# Patient Record
Sex: Male | Born: 1985 | Hispanic: Yes | Marital: Married | Smoking: Never smoker
Health system: Southern US, Community
[De-identification: ages and names within clinical notes are randomized; demographics above are authoritative.]

---

## 2013-08-21 ENCOUNTER — Emergency Department (HOSPITAL_BASED_OUTPATIENT_CLINIC_OR_DEPARTMENT_OTHER)
Admission: EM | Admit: 2013-08-21 | Discharge: 2013-08-21 | Disposition: A | Discharge status: Home / Self Care | Payer: Self-pay | Attending: Emergency Medicine | Admitting: Emergency Medicine

## 2013-08-21 ENCOUNTER — Emergency Department (HOSPITAL_BASED_OUTPATIENT_CLINIC_OR_DEPARTMENT_OTHER): Payer: Self-pay

## 2013-08-21 ENCOUNTER — Encounter (HOSPITAL_BASED_OUTPATIENT_CLINIC_OR_DEPARTMENT_OTHER): Payer: Self-pay | Admitting: Emergency Medicine

## 2013-08-21 DIAGNOSIS — M25579 Pain in unspecified ankle and joints of unspecified foot: Secondary | ICD-10-CM | POA: Insufficient documentation

## 2013-08-21 DIAGNOSIS — M25571 Pain in right ankle and joints of right foot: Secondary | ICD-10-CM

## 2013-08-21 MED ORDER — METHYLPREDNISOLONE SODIUM SUCC 125 MG IJ SOLR
125.0000 mg | Freq: Once | INTRAMUSCULAR | Status: AC
  Administered 2013-08-21: 125 mg via INTRAMUSCULAR
  Filled 2013-08-21: qty 2

## 2013-08-21 MED ORDER — HYDROCODONE-ACETAMINOPHEN 5-325 MG PO TABS: 1.0000 | ORAL_TABLET | Freq: Four times a day (QID) | ORAL | Status: AC | PRN

## 2013-08-21 NOTE — ED Notes (Signed)
Pt c/o rt ankle pain x3wks, no known injury, taking tylenol with no relief.

## 2013-08-21 NOTE — ED Provider Notes (Signed)
CSN: [id number]     Arrival date & time 08/21/13  0212 History   First MD Initiated Contact with Patient 08/21/13 [phone]     Chief Complaint  Patient presents with  . Ankle Pain     (Consider location/radiation/quality/duration/timing/severity/associated sxs/prior Treatment) HPI This is a morbidly obese 28 year old male with a pain in his right ankle for the last 2-3 weeks. The pain has been mild and easily treated with acetaminophen until the last 24 hours. Over the last 24 hours the pain has become severe and not relieved with Tylenol. He has not taken any other medication. He denies any injury. The pain is located deep in the joint and is worse with lateral movement versus flexion and extension. There is no associated swelling or redness. He has no personal history of gout but has a family history of gout.   History reviewed. No pertinent past medical history. History reviewed. No pertinent past surgical history. No family history on file. History  Substance Use Topics  . Smoking status: Never Smoker   . Smokeless tobacco: Not on file  . Alcohol Use: No    Review of Systems  All other systems reviewed and are negative.   Allergies  Asa and Nsaids  Home Medications   Prior to Admission medications   Not on File   BP 105/56  Pulse 64  Temp(Src) 98.2 F (36.8 C) (Oral)  Resp 20  Ht 5\' 9"  (1.753 m)  Wt 398 lb (180.532 kg)  BMI 58.75 kg/m2  SpO2 98%  Physical Exam General: Well-developed, morbidly obese male in no acute distress; appearance consistent with age of record HENT: normocephalic; atraumatic Eyes: pupils equal, round and reactive to light; extraocular muscles intact Neck: supple Heart: regular rate and rhythm Lungs: clear to auscultation bilaterally Abdomen: soft; nondistended; nontender; bowel sounds present Extremities: No deformity; pain on passive range of motion of right ankle, notably medial and lateral stress; right ankle without appreciable warmth,  swelling or erythema; right foot distally neurovascularly intact with intact tendon function Neurologic: Awake, alert and oriented; motor function intact in all extremities and symmetric; no facial droop Skin: Warm and dry Psychiatric: Normal mood and affect    ED Course  Procedures (including critical care time)  MDM  Nursing notes and vitals signs, including pulse oximetry, reviewed.  Summary of this visit's results, reviewed by myself:  Labs:  No results found for this or any previous visit (from the past 24 hour(s)).  Imaging Studies: Dg Ankle Complete Right  08/21/2013   CLINICAL DATA:  Ankle pain for several weeks.  No known injury.  EXAM: RIGHT ANKLE - COMPLETE 3+ VIEW  COMPARISON:  None.  FINDINGS: There is no evidence of fracture, dislocation, or joint effusion. There is no evidence of arthropathy or other focal bone abnormality. Small plantar calcaneal spur, without erosion.  IMPRESSION: Negative.   Electronically Signed   By: Tiburcio PeaJonathan  Watts M.D.   On: 08/21/2013 03:07   Suspect gout due to acute onset of nontraumatic pain. He is morbidly obese and has a family history of gout. He has an anaphylactic reaction to aspirin and nonsteroidals so we will treat with a shot of Solu-Medrol and a narcotic.     Kyle SeamenJohn L Marija Calamari, MD 08/21/13 [phone]

## 2013-08-21 NOTE — ED Notes (Signed)
Patient transported to X-ray 

## 2013-08-21 NOTE — Discharge Instructions (Signed)

## 2013-09-27 ENCOUNTER — Emergency Department (HOSPITAL_BASED_OUTPATIENT_CLINIC_OR_DEPARTMENT_OTHER)
Admission: EM | Admit: 2013-09-27 | Discharge: 2013-09-27 | Disposition: A | Discharge status: Home / Self Care | Payer: Self-pay | Attending: Emergency Medicine | Admitting: Emergency Medicine

## 2013-09-27 ENCOUNTER — Emergency Department (HOSPITAL_BASED_OUTPATIENT_CLINIC_OR_DEPARTMENT_OTHER): Payer: Self-pay

## 2013-09-27 ENCOUNTER — Encounter (HOSPITAL_BASED_OUTPATIENT_CLINIC_OR_DEPARTMENT_OTHER): Payer: Self-pay | Admitting: Emergency Medicine

## 2013-09-27 DIAGNOSIS — B9789 Other viral agents as the cause of diseases classified elsewhere: Secondary | ICD-10-CM | POA: Insufficient documentation

## 2013-09-27 DIAGNOSIS — J029 Acute pharyngitis, unspecified: Secondary | ICD-10-CM | POA: Insufficient documentation

## 2013-09-27 DIAGNOSIS — B349 Viral infection, unspecified: Secondary | ICD-10-CM

## 2013-09-27 LAB — RAPID STREP SCREEN (MED CTR MEBANE ONLY): Streptococcus, Group A Screen (Direct): NEGATIVE

## 2013-09-27 MED ORDER — ACETAMINOPHEN 325 MG PO TABS
650.0000 mg | ORAL_TABLET | Freq: Once | ORAL | Status: AC
  Administered 2013-09-27: 650 mg via ORAL
  Filled 2013-09-27: qty 2

## 2013-09-27 NOTE — ED Notes (Signed)
Patient transported to X-ray 

## 2013-09-27 NOTE — ED Provider Notes (Signed)
CSN: [id number]     Arrival date & time 09/27/13  1420 History   First MD Initiated Contact with Patient 09/27/13 1533     Chief Complaint  Patient presents with  . Generalized Body Aches  . Sore Throat     HPI The patient presents emergent complaints of sore throat, body aches that started yesterday. Patient started having some trouble with sore throat. He feels like it's more difficult to swallow. He's developed generalized body aches and has felt feverish and chilled. He did not measure temperature at home last night. He also noticed that his eyes started becoming red. Has not had any recent travel. He had all his childhood immunizations. He is not aware of anyone has been sick. Has not had any recent tick bites. History reviewed. No pertinent past medical history. History reviewed. No pertinent past surgical history. No family history on file. History  Substance Use Topics  . Smoking status: Never Smoker   . Smokeless tobacco: Not on file  . Alcohol Use: No    Review of Systems  All other systems reviewed and are negative.     Allergies  Asa and Nsaids  Home Medications   Prior to Admission medications   Medication Sig Start Date End Date Taking? Authorizing Provider  HYDROcodone-acetaminophen (NORCO/VICODIN) 5-325 MG per tablet Take 1-2 tablets by mouth every 6 (six) hours as needed. 08/21/13   John L Molpus, MD   BP 133/70  Pulse 81  Temp(Src) 98.9 F (37.2 C) (Oral)  Resp 18  Ht  (1.753 m)  Wt 390 lb (176.903 kg)  BMI 57.57 kg/m2  SpO2 97% Physical Exam  Nursing note and vitals reviewed. Constitutional: No distress.  Obese   HENT:  Head: Normocephalic and atraumatic.  Right Ear: External ear normal.  Left Ear: External ear normal.  Mouth/Throat: Mucous membranes are normal. No oral lesions. No trismus in the jaw. No uvula swelling. Posterior oropharyngeal edema and posterior oropharyngeal erythema present. No oropharyngeal exudate or tonsillar  abscesses.  Eyes: Conjunctivae are normal. Right eye exhibits no discharge. Left eye exhibits no discharge. No scleral icterus.  Neck: Neck supple. No tracheal deviation present.  Cardiovascular: Normal rate, regular rhythm and intact distal pulses.   Pulmonary/Chest: Effort normal. No accessory muscle usage or stridor. Not tachypneic. No respiratory distress. He has no decreased breath sounds. He has wheezes (few). He has no rales.  Abdominal: Soft. Bowel sounds are normal. He exhibits no distension. There is no tenderness. There is no rebound and no guarding.  Musculoskeletal: He exhibits no edema and no tenderness.  Neurological: He is alert. He has normal strength. No cranial nerve deficit (no facial droop, extraocular movements intact, no slurred speech) or sensory deficit. He exhibits normal muscle tone. He displays no seizure activity. Coordination normal.  Skin: Skin is warm. No rash noted. He is not diaphoretic.  Psychiatric: He has a normal mood and affect.    ED Course  Procedures (including critical care time) Labs Review Labs Reviewed  RAPID STREP SCREEN  CULTURE, GROUP A STREP  STREP A CULTURE, THROAT    Imaging Review Dg Chest 2 View  09/27/2013   CLINICAL DATA:  Generalized body aches, wheezing  EXAM: CHEST  2 VIEW  COMPARISON:  None.  FINDINGS: The heart size and mediastinal contours are within normal limits. Both lungs are clear. The visualized skeletal structures are unremarkable.  IMPRESSION: No active cardiopulmonary disease.   Electronically Signed   By: Isac Caddy.D.  On: 09/27/2013 16:11     MDM   Final diagnoses:  Pharyngitis  Viral illness    Non toxic.  Likely viral illness.  Has conjunctival injection and sore throat.    No pna on xray.  Tylenol for symptoms.  Follow up with PCP    Linwood Dibbles, MD 09/27/13 [phone]

## 2013-09-27 NOTE — Discharge Instructions (Signed)

## 2013-09-27 NOTE — ED Notes (Signed)
Pt. Reports sore throat and body aches with fever off and on.  At home fever was highest 98.9 per wife.

## 2013-09-29 LAB — CULTURE, GROUP A STREP

## 2013-10-01 ENCOUNTER — Emergency Department (HOSPITAL_BASED_OUTPATIENT_CLINIC_OR_DEPARTMENT_OTHER)
Admission: EM | Admit: 2013-10-01 | Discharge: 2013-10-01 | Disposition: A | Discharge status: Home / Self Care | Payer: Self-pay | Attending: Emergency Medicine | Admitting: Emergency Medicine

## 2013-10-01 ENCOUNTER — Encounter (HOSPITAL_BASED_OUTPATIENT_CLINIC_OR_DEPARTMENT_OTHER): Payer: Self-pay | Admitting: Emergency Medicine

## 2013-10-01 DIAGNOSIS — R51 Headache: Secondary | ICD-10-CM | POA: Insufficient documentation

## 2013-10-01 DIAGNOSIS — G4489 Other headache syndrome: Secondary | ICD-10-CM | POA: Insufficient documentation

## 2013-10-01 MED ORDER — SODIUM CHLORIDE 0.9 % IV BOLUS (SEPSIS)
1000.0000 mL | Freq: Once | INTRAVENOUS | Status: AC
  Administered 2013-10-01: 1000 mL via INTRAVENOUS

## 2013-10-01 MED ORDER — METOCLOPRAMIDE HCL 5 MG/ML IJ SOLN
10.0000 mg | Freq: Once | INTRAMUSCULAR | Status: AC
  Administered 2013-10-01: 10 mg via INTRAVENOUS
  Filled 2013-10-01: qty 2

## 2013-10-01 MED ORDER — DIPHENHYDRAMINE HCL 50 MG/ML IJ SOLN
25.0000 mg | Freq: Once | INTRAMUSCULAR | Status: AC
Start: 2013-10-01 — End: 2013-10-01
  Administered 2013-10-01: 25 mg via INTRAVENOUS
  Filled 2013-10-01: qty 1

## 2013-10-01 NOTE — ED Notes (Signed)
Headache. Was seen for same on Friday.

## 2013-10-01 NOTE — ED Notes (Signed)
PT discharged to home with family. NAD. 

## 2013-10-01 NOTE — Discharge Instructions (Signed)
Return to the ED with any concerns including fever/chills, difficulty breathing or swallowing, changes in vision or speech, vomiting and not able to keep down liquids, decreased level of alertness/lethargy, or any other alarming symptoms

## 2013-10-01 NOTE — ED Provider Notes (Signed)
CSN: [id number]     Arrival date & time 10/01/13  1853 History  This chart was scribed for Kyle Chick, MD by Roxy Cedar, ED Scribe. This patient was seen in room MH03/MH03 and the patient's care was started at 8:52 PM.   Chief Complaint  Patient presents with  . Headache   Patient is a 28 y.o. male presenting with headaches. The history is provided by the patient. No language interpreter was used.  Headache Pain location:  Generalized Quality:  Sharp Radiates to:  Does not radiate Onset quality:  Gradual Duration:  1 day Timing:  Constant Progression:  Unchanged Chronicity:  Recurrent Relieved by:  Nothing Worsened by:  Nothing tried Ineffective treatments:  None tried Associated symptoms: sinus pressure   Associated symptoms: no fever     HPI Comments: Kyle Kent is a 28 y.o. male who is allergic to [place] and Nsaids, who presents to the Emergency Department complaining of moderate headache that began yesterday. Patient states that he feels associated pressure behind his eyes. He states that he cannot perform any sudden movement without feeling moderate discomfort and pain. Patient was seen last week with viral symptoms. He states that he feels weak due to headache but symptoms from last week have improved. HA gradual in onset, throbbing in nature.    History reviewed. No pertinent past medical history. History reviewed. No pertinent past surgical history. No family history on file. History  Substance Use Topics  . Smoking status: Never Smoker   . Smokeless tobacco: Not on file  . Alcohol Use: No    Review of Systems  Constitutional: Negative for fever.  HENT: Positive for sinus pressure.   Genitourinary: Negative for difficulty urinating.  Neurological: Positive for headaches.  All other systems reviewed and are negative.  Allergies  Asa and Nsaids  Home Medications   Prior to Admission medications   Medication Sig Start Date End Date Taking?  Authorizing Provider  HYDROcodone-acetaminophen (NORCO/VICODIN) 5-325 MG per tablet Take 1-2 tablets by mouth every 6 (six) hours as needed. 08/21/13   Carlisle Beers Molpus, MD   Triage Vitals: BP 135/74  Pulse 84  Temp(Src) 98.5 F (36.9 C) (Oral)  Resp 20  Ht  (1.753 m)  Wt 390 lb (176.903 kg)  BMI 57.57 kg/m2  SpO2 98%  Physical Exam  Nursing note and vitals reviewed. Constitutional: He is oriented to person, place, and time. He appears well-developed and well-nourished. No distress.  HENT:  Head: Normocephalic and atraumatic.  Nose: Nose normal.  Mouth/Throat: Oropharynx is clear and moist. No oropharyngeal exudate.  No pharyngitis noted.  Eyes: Conjunctivae and EOM are normal. Pupils are equal, round, and reactive to light. Right eye exhibits no discharge. Left eye exhibits no discharge.  Neck: Neck supple. No tracheal deviation present.  Cardiovascular: Normal rate.   Pulmonary/Chest: Effort normal. No respiratory distress.  Musculoskeletal: Normal range of motion.  Neurological: He is alert and oriented to person, place, and time. He has normal strength and normal reflexes. No cranial nerve deficit. He exhibits normal muscle tone. Coordination normal.  Normal neurologic exam. Cranial nerves II-XII tested and intact. Strength 5/5. Sensation intact.   Skin: Skin is warm and dry.  Psychiatric: He has a normal mood and affect. His behavior is normal.   ED Course  Procedures (including critical care time)  DIAGNOSTIC STUDIES: Oxygen Saturation is 98% on RA, normal by my interpretation.    COORDINATION OF CARE: 8:55 PM- Will give patient  Reglan, Benadryl and NaCl Bolus for pain and sinus management. Discussed plans to discharge. Pt advised of plan for treatment and pt agrees.  10:50 PM pt is sleeping and has had much improvement in headache.    Labs Review Labs Reviewed - No data to display  Imaging Review No results found.   EKG Interpretation None     MDM   Final  diagnoses:  Other headache syndrome    Pt presenting with headache in the setting of recent ED visit for viral infection/pharyngitis.  He states other symptoms have improved, but then he had gradual onset of headache.  No fever, no meningeal signs.  Normal neuro exam.  Dobut meningitis, SAH.  Pt feels improved after reglan/benadryl.  Discharged with strict return precautions.  Pt agreeable with plan.  I personally performed the services described in this documentation, which was scribed in my presence. The recorded information has been reviewed and is accurate.    Kyle Chick, MD 10/01/13 2322

## 2014-02-21 ENCOUNTER — Emergency Department (HOSPITAL_BASED_OUTPATIENT_CLINIC_OR_DEPARTMENT_OTHER)
Admission: EM | Admit: 2014-02-21 | Discharge: 2014-02-22 | Disposition: A | Discharge status: Home / Self Care | Payer: Self-pay | Attending: Emergency Medicine | Admitting: Emergency Medicine

## 2014-02-21 ENCOUNTER — Encounter (HOSPITAL_BASED_OUTPATIENT_CLINIC_OR_DEPARTMENT_OTHER): Payer: Self-pay | Admitting: Emergency Medicine

## 2014-02-21 ENCOUNTER — Emergency Department (HOSPITAL_BASED_OUTPATIENT_CLINIC_OR_DEPARTMENT_OTHER): Payer: Self-pay

## 2014-02-21 DIAGNOSIS — M778 Other enthesopathies, not elsewhere classified: Secondary | ICD-10-CM | POA: Insufficient documentation

## 2014-02-21 DIAGNOSIS — Z5689 Other problems related to employment: Secondary | ICD-10-CM | POA: Insufficient documentation

## 2014-02-21 NOTE — ED Notes (Signed)
Patient reports that he is having left sided elbow pain x 2 -3 days. The patient reports that he was doing his typical work. Patient reports that his shoulder and wrist hurt as well.

## 2014-02-22 MED ORDER — OXYCODONE-ACETAMINOPHEN 5-325 MG PO TABS
2.0000 | ORAL_TABLET | Freq: Once | ORAL | Status: AC
  Administered 2014-02-22: 2 via ORAL
  Filled 2014-02-22: qty 2

## 2014-02-22 MED ORDER — OXYCODONE-ACETAMINOPHEN 5-325 MG PO TABS: 1.0000 | ORAL_TABLET | ORAL | Status: AC | PRN

## 2014-02-22 MED ORDER — PREDNISONE 10 MG PO TABS
60.0000 mg | ORAL_TABLET | Freq: Once | ORAL | Status: AC
  Administered 2014-02-22: 60 mg via ORAL
  Filled 2014-02-22 (×2): qty 1

## 2014-02-22 MED ORDER — METHYLPREDNISOLONE (PAK) 4 MG PO TABS: ORAL_TABLET | ORAL | Status: AC

## 2014-02-22 NOTE — ED Provider Notes (Signed)
TIME SEEN: 12:55 AM  CHIEF COMPLAINT: left elbow pain  HPI: Pt is a 29 y.o. morbidly obese left-hand dominant male who presents emergency department with left elbow pain for the past 2-3 days. Patient reports his pain is worse with movement of the arm. No fever. No history of injury. His elbow is swollen. Has never had similar symptoms.  ROS: See HPI Constitutional: no fever  Eyes: no drainage  ENT: no runny nose   Cardiovascular:  no chest pain  Resp: no SOB  GI: no vomiting GU: no dysuria Integumentary: no rash  Allergy: no hives  Musculoskeletal: no leg swelling  Neurological: no slurred speech ROS otherwise negative  PAST MEDICAL HISTORY/PAST SURGICAL HISTORY:  History reviewed. No pertinent past medical history.  MEDICATIONS:  Prior to Admission medications   Medication Sig Start Date End Date Taking? Authorizing Provider  HYDROcodone-acetaminophen (NORCO/VICODIN) 5-325 MG per tablet Take 1-2 tablets by mouth every 6 (six) hours as needed. 08/21/13   Carlisle BeersJohn L Molpus, MD    ALLERGIES:  Allergies  Allergen Reactions  . Asa [Aspirin] Anaphylaxis  . Nsaids Anaphylaxis    SOCIAL HISTORY:  History  Substance Use Topics  . Smoking status: Never Smoker   . Smokeless tobacco: Not on file  . Alcohol Use: No    FAMILY HISTORY: History reviewed. No pertinent family history.  EXAM: BP 133/81 mmHg  Pulse 86  Temp(Src) 98.4 F (36.9 C) (Oral)  Resp 18  Wt 395 lb (179.171 kg)  SpO2 100% CONSTITUTIONAL: Alert and oriented and responds appropriately to questions. Well-appearing; well-nourished HEAD: Normocephalic EYES: Conjunctivae clear, PERRL ENT: normal nose; no rhinorrhea; moist mucous membranes; pharynx without lesions noted NECK: Supple, no meningismus, no LAD  CARD: RRR; S1 and S2 appreciated; no murmurs, no clicks, no rubs, no gallops RESP: Normal chest excursion without splinting or tachypnea; breath sounds clear and equal bilaterally; no wheezes, no rhonchi, no  rales,  ABD/GI: Normal bowel sounds; non-distended; soft, non-tender, no rebound, no guarding BACK:  The back appears normal and is non-tender to palpation, there is no CVA tenderness EXT: patient's left elbow is swollen and tender to palpation diffusely without erythema or warmth, no swelling of the olecranon bursa, pain with flexion and extension of the elbow but he is able to do it passively, 2+ radial pulses bilaterally, no tenderness at the left shoulder or left wrist, compartments are soft, sensation to light touch intact diffusely, otherwise extremities are nontender to palpation, Normal ROM in all joints; no edema; normal capillary refill; no cyanosis    SKIN: Normal color for age and race; warm NEURO: Moves all extremities equally PSYCH: The patient's mood and manner are appropriate. Grooming and personal hygiene are appropriate.  MEDICAL DECISION MAKING: Patient here with what I suspect is left elbow tendinitis. He is left-hand dominant and has a job or he uses his arm frequently with repetitive motion. We'll discharge home with pain medication as well as a steroid taper. Patient states he is allergic to NSAIDs. We'll also get outpatient orthopedic follow-up information to use as needed if pain continues. Discussed the importance of elevation, ice, rest. We'll provide work note. We'll also provide sling for comfort. No signs of septic arthritis, bursitis on exam. Patient verbalizes understanding and is comfortable with plan.      Layla MawKristen N Ward, DO 02/22/14 [phone]

## 2014-02-22 NOTE — ED Notes (Signed)
MD at bedside. 

## 2014-02-22 NOTE — Discharge Instructions (Signed)
Tendinitis °Tendinitis is swelling and inflammation of the tendons. Tendons are band-like tissues that connect muscle to bone. Tendinitis commonly occurs in the:  °· Shoulders (rotator cuff). °· Heels (Achilles tendon). °· Elbows (triceps tendon). °CAUSES °Tendinitis is usually caused by overusing the tendon, muscles, and joints involved. When the tissue surrounding a tendon (synovium) becomes inflamed, it is called tenosynovitis. Tendinitis commonly develops in people whose jobs require repetitive motions. °SYMPTOMS °· Pain. °· Tenderness. °· Mild swelling. °DIAGNOSIS °Tendinitis is usually diagnosed by physical exam. Your health care provider may also order X-rays or other imaging tests. °TREATMENT °Your health care provider may recommend certain medicines or exercises for your treatment. °HOME CARE INSTRUCTIONS  °· Use a sling or splint for as long as directed by your health care provider until the pain decreases. °· Put ice on the injured area. °¨ Put ice in a plastic bag. °¨ Place a towel between your skin and the bag. °¨ Leave the ice on for 15-20 minutes, 3-4 times a day, or as directed by your health care provider. °· Avoid using the limb while the tendon is painful. Perform gentle range of motion exercises only as directed by your health care provider. Stop exercises if pain or discomfort increase, unless directed otherwise by your health care provider. °· Only take over-the-counter or prescription medicines for pain, discomfort, or fever as directed by your health care provider. °SEEK MEDICAL CARE IF:  °· Your pain and swelling increase. °· You develop new, unexplained symptoms, especially increased numbness in the hands. °MAKE SURE YOU:  °· Understand these instructions. °· Will watch your condition. °· Will get help right away if you are not doing well or get worse. °Document Released: 01/01/2000 Document Revised: 05/20/2013 Document Reviewed: 03/22/2010 °[employer]® Patient Information ©2015 [employer]  [employer]. This information is not intended to replace advice given to you by your health care provider. Make sure you discuss any questions you have with your health care provider. ° ° °RICE: Routine Care for Injuries °The routine care of many injuries includes Rest, Ice, Compression, and Elevation (RICE). °HOME CARE INSTRUCTIONS °· Rest is needed to allow your body to heal. Routine activities can usually be resumed when comfortable. Injured tendons and bones can take up to 6 weeks to heal. Tendons are the cord-like structures that attach muscle to bone. °· Ice following an injury helps keep the swelling down and reduces pain. °¨ Put ice in a plastic bag. °¨ Place a towel between your skin and the bag. °¨ Leave the ice on for 15-20 minutes, 3-4 times a day, or as directed by your health care provider. Do this while awake, for the first 24 to 48 hours. After that, continue as directed by your caregiver. °· Compression helps keep swelling down. It also gives support and helps with discomfort. If an elastic bandage has been applied, it should be removed and reapplied every 3 to 4 hours. It should not be applied tightly, but firmly enough to keep swelling down. Watch fingers or toes for swelling, bluish discoloration, coldness, numbness, or excessive pain. If any of these problems occur, remove the bandage and reapply loosely. Contact your caregiver if these problems continue. °· Elevation helps reduce swelling and decreases pain. With extremities, such as the arms, hands, legs, and feet, the injured area should be placed near or above the level of the heart, if possible. °SEEK IMMEDIATE MEDICAL CARE IF: °· You have persistent pain and swelling. °· You develop redness, numbness, or unexpected weakness. °·   Your symptoms are getting worse rather than improving after several days. °These symptoms may indicate that further evaluation or further X-rays are needed. Sometimes, X-rays may not show a small broken bone (fracture)  until 1 week or 10 days later. Make a follow-up appointment with your caregiver. Ask when your X-ray results will be ready. Make sure you get your X-ray results. °Document Released: 04/17/2000 Document Revised: 01/08/2013 Document Reviewed: 06/04/2010 °Exit[employer]® Patient Information ©2015 [employer]. This information is not intended to replace advice given to you by your health care provider. Make sure you discuss any questions you have with your health care provider. ° °

## 2014-02-22 NOTE — ED Notes (Signed)
Pt states moving arm is painful now and will apply sling when pain goes down. Pt and family member were given and shown instructions on applying the sling. RN Caryl AspKellie informed.

## 2014-02-22 NOTE — ED Notes (Signed)
Patient asked if he could stay until he was pain free. The patient given opportunity to stay a little longer, but advised that he would feel better if he went home and rested. Talked about the time it takes for pain medication to work and how long the healing process could be. Patient also would not let tech put sling on because of too much pain, patient was instructed how to put the sling on when he felt like he was ready However when the patient left he had placed the sling on.

## 2016-04-10 IMAGING — CR DG CHEST 2V
2 series · 2 of 2 positions shown · non-contrast
Comparison: None.

CLINICAL DATA: Generalized body aches, wheezing

EXAM:
CHEST  2 VIEW

[w chest pa *]
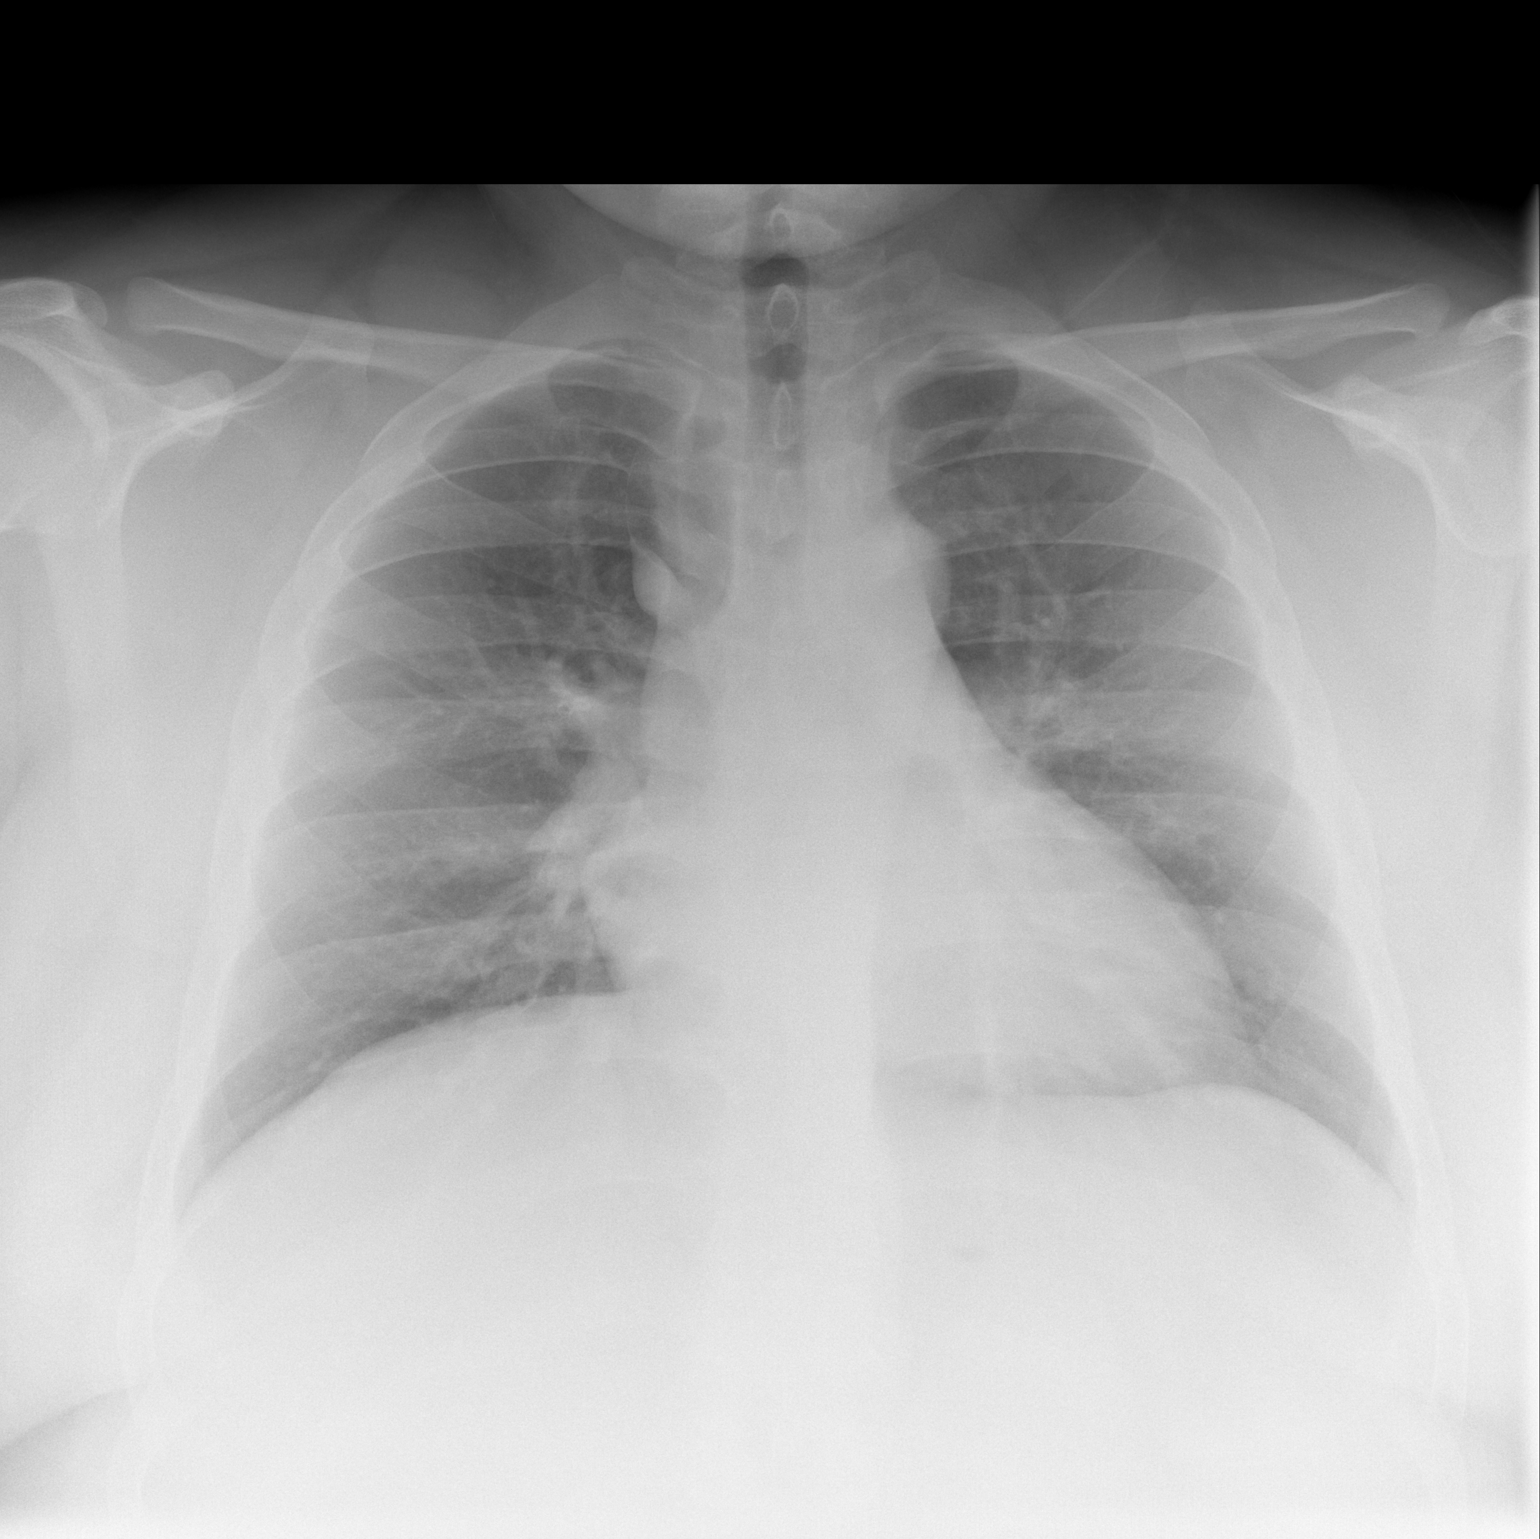

[w chest lat *]
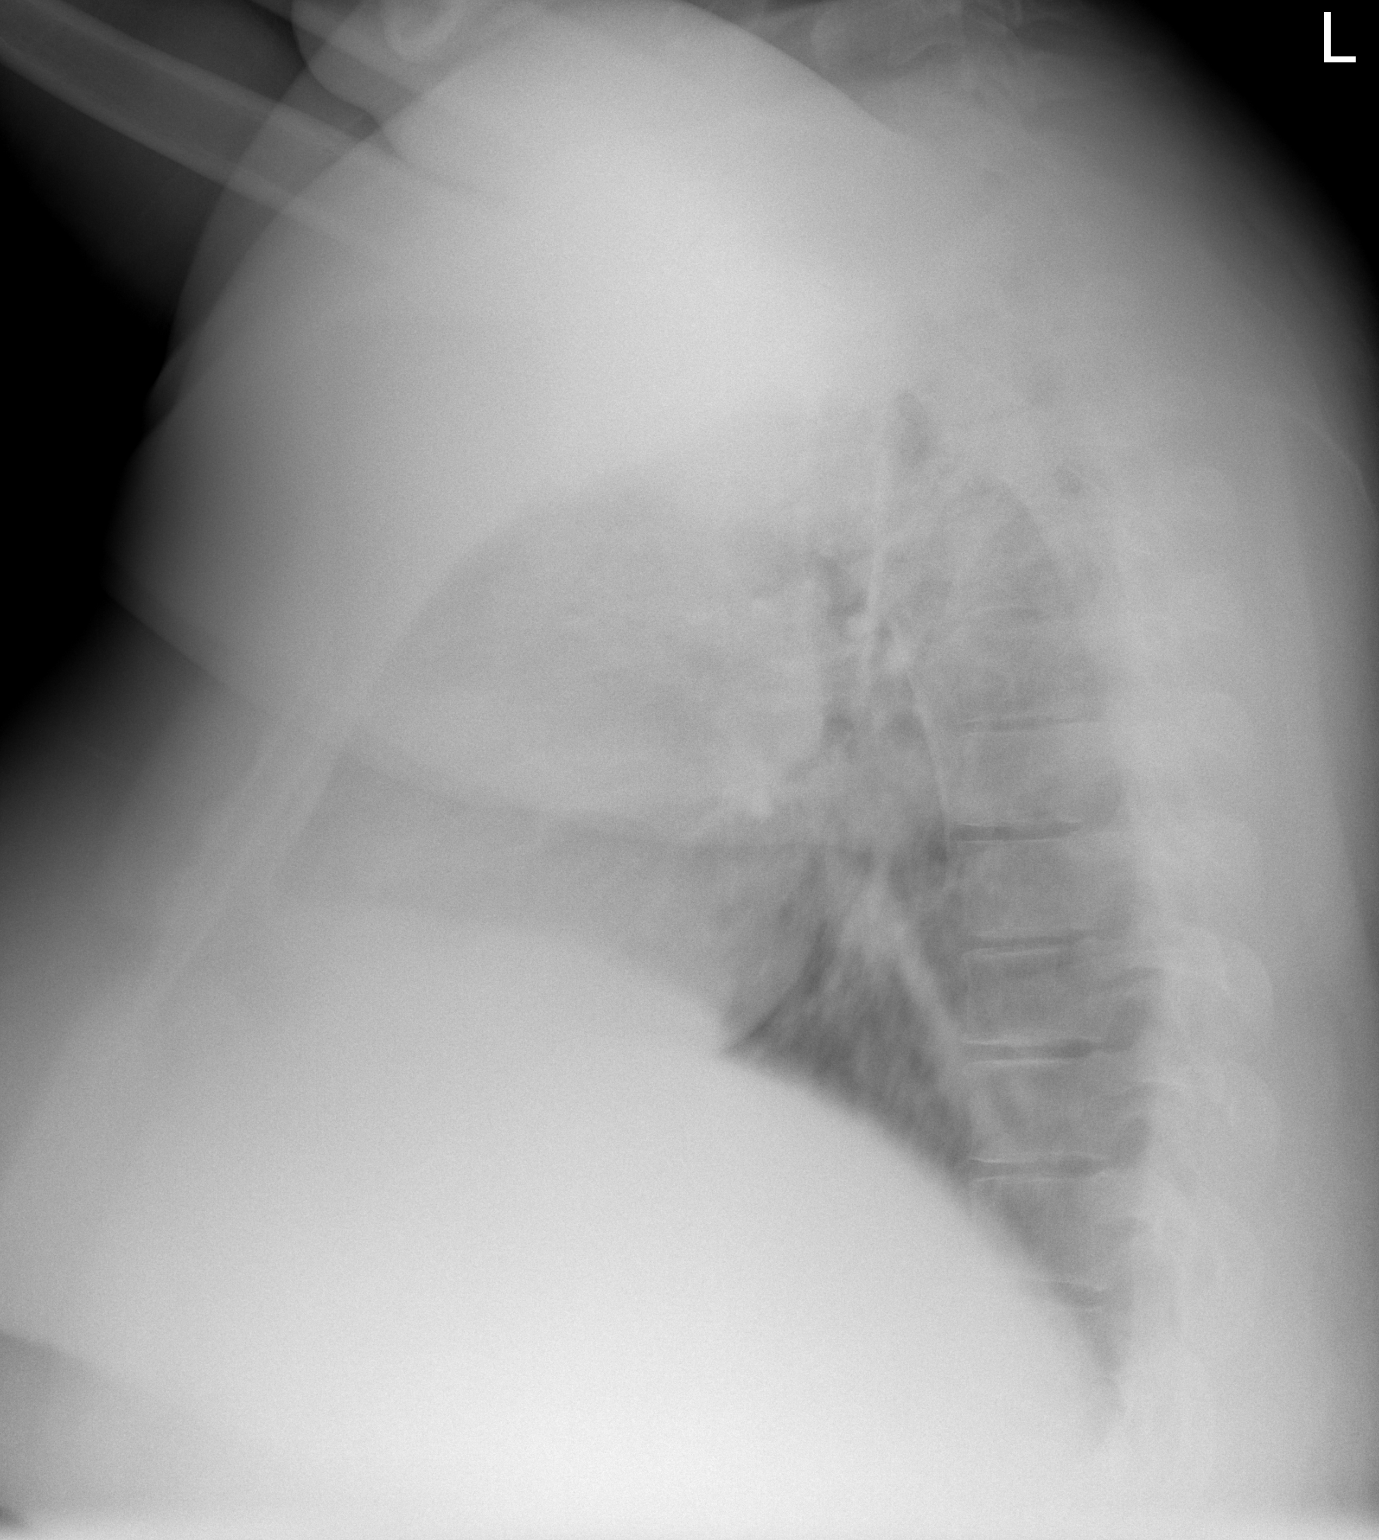

[2 of 2 positions shown; findings below may reference images not displayed]

FINDINGS: The heart size and mediastinal contours are within normal limits.
Both lungs are clear. The visualized skeletal structures are
unremarkable.
IMPRESSION: No active cardiopulmonary disease.
